# Patient Record
Sex: Male | Born: 1975 | Race: Black or African American | Hispanic: No
Health system: Southern US, Community
[De-identification: ages and names within clinical notes are randomized; demographics above are authoritative.]

---

## 2017-10-05 ENCOUNTER — Emergency Department (HOSPITAL_COMMUNITY): Payer: Self-pay

## 2017-10-05 ENCOUNTER — Encounter (HOSPITAL_COMMUNITY): Payer: Self-pay | Admitting: Emergency Medicine

## 2017-10-05 ENCOUNTER — Other Ambulatory Visit: Payer: Self-pay

## 2017-10-05 ENCOUNTER — Emergency Department (HOSPITAL_COMMUNITY)
Admission: EM | Admit: 2017-10-05 | Discharge: 2017-10-05 | Disposition: A | Discharge status: Home / Self Care | Payer: Self-pay | Attending: Emergency Medicine | Admitting: Emergency Medicine

## 2017-10-05 DIAGNOSIS — W3400XA Accidental discharge from unspecified firearms or gun, initial encounter: Secondary | ICD-10-CM

## 2017-10-05 DIAGNOSIS — S42111B Displaced fracture of body of scapula, right shoulder, initial encounter for open fracture: Secondary | ICD-10-CM | POA: Insufficient documentation

## 2017-10-05 DIAGNOSIS — Z23 Encounter for immunization: Secondary | ICD-10-CM | POA: Insufficient documentation

## 2017-10-05 DIAGNOSIS — Y998 Other external cause status: Secondary | ICD-10-CM | POA: Insufficient documentation

## 2017-10-05 DIAGNOSIS — Y929 Unspecified place or not applicable: Secondary | ICD-10-CM | POA: Insufficient documentation

## 2017-10-05 DIAGNOSIS — Y939 Activity, unspecified: Secondary | ICD-10-CM | POA: Insufficient documentation

## 2017-10-05 DIAGNOSIS — S1184XA Puncture wound with foreign body of other specified part of neck, initial encounter: Secondary | ICD-10-CM | POA: Insufficient documentation

## 2017-10-05 LAB — COMPREHENSIVE METABOLIC PANEL
ALT: 8 U/L (ref 0–44)
ANION GAP: 17 — AB (ref 5–15)
AST: 30 U/L (ref 15–41)
Albumin: 4.1 g/dL (ref 3.5–5.0)
Alkaline Phosphatase: 73 U/L (ref 38–126)
BILIRUBIN TOTAL: 0.9 mg/dL (ref 0.3–1.2)
BUN: 10 mg/dL (ref 6–20)
CHLORIDE: 103 mmol/L (ref 98–111)
CO2: 19 mmol/L — ABNORMAL LOW (ref 22–32)
Calcium: 8.7 mg/dL — ABNORMAL LOW (ref 8.9–10.3)
Creatinine, Ser: 1.49 mg/dL — ABNORMAL HIGH (ref 0.61–1.24)
GFR, EST NON AFRICAN AMERICAN: 56 mL/min — AB (ref >60)
Glucose, Bld: 192 mg/dL — ABNORMAL HIGH (ref 70–99)
POTASSIUM: 4.3 mmol/L (ref 3.5–5.1)
Sodium: 139 mmol/L (ref 135–145)
TOTAL PROTEIN: 6.6 g/dL (ref 6.5–8.1)

## 2017-10-05 LAB — CBC
HEMATOCRIT: 48.9 % (ref 39.0–52.0)
Hemoglobin: 16.2 g/dL (ref 13.0–17.0)
MCH: 32.7 pg (ref 26.0–34.0)
MCHC: 33.1 g/dL (ref 30.0–36.0)
MCV: 98.8 fL (ref 78.0–100.0)
PLATELETS: 115 10*3/uL — AB (ref 150–400)
RBC: 4.95 MIL/uL (ref 4.22–5.81)
RDW: 12.1 % (ref 11.5–15.5)
WBC: 9.6 10*3/uL (ref 4.0–10.5)

## 2017-10-05 MED ORDER — HYDROCODONE-ACETAMINOPHEN 5-325 MG PO TABS: 1.0000 | ORAL_TABLET | ORAL | 0 refills | Status: AC | PRN

## 2017-10-05 MED ORDER — BACITRACIN-NEOMYCIN-POLYMYXIN 400-5-5000 EX OINT
TOPICAL_OINTMENT | Freq: Once | CUTANEOUS | Status: DC
  Filled 2017-10-05: qty 1

## 2017-10-05 MED ORDER — CEFAZOLIN SODIUM-DEXTROSE 2-4 GM/100ML-% IV SOLN
2.0000 g | Freq: Once | INTRAVENOUS | Status: AC
  Administered 2017-10-05: 2 g via INTRAVENOUS
  Filled 2017-10-05: qty 100

## 2017-10-05 MED ORDER — SODIUM CHLORIDE 0.9 % IV BOLUS
1000.0000 mL | Freq: Once | INTRAVENOUS | Status: AC
  Administered 2017-10-05: 1000 mL via INTRAVENOUS

## 2017-10-05 MED ORDER — IOPAMIDOL (ISOVUE-370) INJECTION 76%
100.0000 mL | Freq: Once | INTRAVENOUS | Status: AC | PRN
  Administered 2017-10-05: 100 mL via INTRAVENOUS

## 2017-10-05 MED ORDER — LIDOCAINE-EPINEPHRINE (PF) 2 %-1:200000 IJ SOLN
10.0000 mL | Freq: Once | INTRAMUSCULAR | Status: AC
  Administered 2017-10-05: 10 mL
  Filled 2017-10-05: qty 20

## 2017-10-05 MED ORDER — HYDROMORPHONE HCL 1 MG/ML IJ SOLN
1.0000 mg | Freq: Once | INTRAMUSCULAR | Status: AC
  Administered 2017-10-05: 1 mg via INTRAVENOUS
  Filled 2017-10-05: qty 1

## 2017-10-05 MED ORDER — TETANUS-DIPHTH-ACELL PERTUSSIS 5-2.5-18.5 LF-MCG/0.5 IM SUSP
0.5000 mL | Freq: Once | INTRAMUSCULAR | Status: AC
  Administered 2017-10-05: 0.5 mL via INTRAMUSCULAR
  Filled 2017-10-05: qty 0.5
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
## 2017-10-05 NOTE — ED Notes (Signed)
ED Provider at bedside. 
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
